# Patient Record
Sex: Male | Born: 1975 | Race: Black or African American | Hispanic: No | Marital: Single | Smoking: Current every day smoker
Health system: Southern US, Community
[De-identification: ages and names within clinical notes are randomized; demographics above are authoritative.]

## PROBLEM LIST (undated history)

## (undated) ENCOUNTER — Emergency Department (HOSPITAL_COMMUNITY): Admission: EM | Payer: Self-pay | Source: Home / Self Care

## (undated) DIAGNOSIS — F1120 Opioid dependence, uncomplicated: Secondary | ICD-10-CM

---

## 2017-05-02 ENCOUNTER — Emergency Department (HOSPITAL_COMMUNITY)
Admission: EM | Admit: 2017-05-02 | Discharge: 2017-05-03 | Disposition: A | Discharge status: Home / Self Care | Payer: Self-pay | Attending: Emergency Medicine | Admitting: Emergency Medicine

## 2017-05-02 ENCOUNTER — Encounter (HOSPITAL_COMMUNITY): Payer: Self-pay | Admitting: Emergency Medicine

## 2017-05-02 DIAGNOSIS — M25511 Pain in right shoulder: Secondary | ICD-10-CM | POA: Insufficient documentation

## 2017-05-02 DIAGNOSIS — R200 Anesthesia of skin: Secondary | ICD-10-CM | POA: Insufficient documentation

## 2017-05-02 DIAGNOSIS — Z5321 Procedure and treatment not carried out due to patient leaving prior to being seen by health care provider: Secondary | ICD-10-CM | POA: Insufficient documentation

## 2017-05-02 NOTE — ED Triage Notes (Signed)
Pt reports R shoulder pain and R hand weakness since 2000. Pt reports pain with arm movement. Grip noted to be weaker, no neuro deficits.

## 2017-05-03 ENCOUNTER — Other Ambulatory Visit: Payer: Self-pay

## 2017-05-03 ENCOUNTER — Encounter (HOSPITAL_COMMUNITY): Payer: Self-pay | Admitting: Emergency Medicine

## 2017-05-03 DIAGNOSIS — M21332 Wrist drop, left wrist: Secondary | ICD-10-CM | POA: Insufficient documentation

## 2017-05-03 DIAGNOSIS — M21331 Wrist drop, right wrist: Secondary | ICD-10-CM | POA: Insufficient documentation

## 2017-05-03 DIAGNOSIS — F17200 Nicotine dependence, unspecified, uncomplicated: Secondary | ICD-10-CM | POA: Insufficient documentation

## 2017-05-03 NOTE — ED Notes (Signed)
Pt up to nurse first desk stating he wants to leave. Apologized for wait, LWBS after triage

## 2017-05-03 NOTE — ED Triage Notes (Signed)
Pt was seen at [facility] ER this morning for flaccid rt hand.  Pt states that since he was there, his left arm is now experiencing the same symptoms.  Pt cannot extend back of hand in an upward motion.  Pt was dx with neuropraxia/cervical radiculopathy.  Pt on methadone.

## 2017-05-04 ENCOUNTER — Emergency Department (HOSPITAL_COMMUNITY)
Admission: EM | Admit: 2017-05-04 | Discharge: 2017-05-04 | Disposition: A | Discharge status: Home / Self Care | Payer: Self-pay | Attending: Emergency Medicine | Admitting: Emergency Medicine

## 2017-05-04 ENCOUNTER — Encounter (HOSPITAL_COMMUNITY): Payer: Self-pay | Admitting: Emergency Medicine

## 2017-05-04 ENCOUNTER — Emergency Department (HOSPITAL_COMMUNITY): Payer: Self-pay

## 2017-05-04 DIAGNOSIS — M21332 Wrist drop, left wrist: Secondary | ICD-10-CM

## 2017-05-04 DIAGNOSIS — M21331 Wrist drop, right wrist: Secondary | ICD-10-CM

## 2017-05-04 DIAGNOSIS — G935 Compression of brain: Secondary | ICD-10-CM

## 2017-05-04 HISTORY — DX: Opioid dependence, uncomplicated: F11.20

## 2017-05-04 LAB — I-STAT CHEM 8, ED
BUN: 11 mg/dL (ref 6–20)
CHLORIDE: 101 mmol/L (ref 101–111)
Calcium, Ion: 1.2 mmol/L (ref 1.15–1.40)
Creatinine, Ser: 0.8 mg/dL (ref 0.61–1.24)
GLUCOSE: 105 mg/dL — AB (ref 65–99)
HCT: 35 % — ABNORMAL LOW (ref 39.0–52.0)
Hemoglobin: 11.9 g/dL — ABNORMAL LOW (ref 13.0–17.0)
POTASSIUM: 3.7 mmol/L (ref 3.5–5.1)
Sodium: 138 mmol/L (ref 135–145)
TCO2: 26 mmol/L (ref 22–32)

## 2017-05-04 MED ORDER — DICLOFENAC SODIUM ER 100 MG PO TB24
100.0000 mg | ORAL_TABLET | Freq: Every day | ORAL | 0 refills | Status: AC
Start: 2017-05-04 — End: ?

## 2017-05-04 NOTE — ED Notes (Signed)
Patient waked from waiting room to treatment room 23, and was able to stand and raise arms to put the gown on.

## 2017-05-04 NOTE — ED Notes (Signed)
CT called. Was told it would be approximately 20 minutes.

## 2017-05-04 NOTE — ED Notes (Signed)
Patient was told to go to [facility] and check in at registration. Patient left going to [facility].

## 2017-05-04 NOTE — Discharge Instructions (Addendum)
Please go to [facility] for MRI of your cervical spine

## 2017-05-04 NOTE — ED Notes (Signed)
Pt. Return from CT via stretcher. 

## 2017-05-04 NOTE — ED Notes (Signed)
Dr. Palumbo at bedside. 

## 2017-05-04 NOTE — ED Notes (Signed)
Patient went to [facility] earlier today to get the numbness in his right arm evaluated. Now he states it is in his left hand now.

## 2017-05-04 NOTE — ED Notes (Signed)
Ortho paged. 

## 2017-05-04 NOTE — Progress Notes (Signed)
Orthopedic Tech Progress Note Patient Details:  Austin Green 1976/01/25 [id number]  Ortho Devices Type of Ortho Device: Velcro wrist splint Ortho Device/Splint Location: bilateral Ortho Device/Splint Interventions: Application   Austin Green 05/04/2017, 7:50 AM

## 2017-05-04 NOTE — ED Provider Notes (Signed)
[facility]-EMERGENCY DEPT Provider Note   CSN: [id number] Arrival date & time: 05/03/17  2235     History   Chief Complaint Chief Complaint  Patient presents with  . Hand Problem    HPI Austin Green is a 41 y.o. male.  HPI Austin Green is a 41 y.o. male with no medical problems, history of opiate addiction currently on methadone, presents to emergency department complaining of bilateral wrist weakness.  Patient states he woke up yesterday morning with weakness to the right hand.  He states he could not extend his wrist.  He was seen at [facility] emergency department, was told that he had radiculopathy, was given a wrist splint.  He states no imaging was performed.  He states he woke up this morning with weakness in his left wrist.  He does admit that he was blowing leaves the day before and feels like he strained his right upper arm.  He states he did have a backpack leaf blower and was blowing leaves with his right arm.  He denies any pain in his left arm.  He does endorse tingling sensation to bilateral thumbs.  He states he cannot raise his thumbs or his wrists.  He states he was given "some type of shot" [facility] department.  He denies any neck pain or injuries.  He denies any headache.  No fever or chills.  Denies any IV drug use.  Denies difficulty ambulating or any other symptoms.  Past Medical History:  Diagnosis Date  . Long-term current use of methadone for opiate dependence (HCC)   . Opiate addiction (HCC)     There are no active problems to display for this patient.   History reviewed. No pertinent surgical history.     Home Medications    Prior to Admission medications   Not on File    Family History History reviewed. No pertinent family history.  Social History Social History   Tobacco Use  . Smoking status: Current Every Day Smoker  . Smokeless tobacco: Never Used  Substance Use Topics  . Alcohol use: No   Frequency: Never  . Drug use: No     Allergies   Patient has no known allergies.   Review of Systems Review of Systems  Constitutional: Negative for chills and fever.  Respiratory: Negative for cough, chest tightness and shortness of breath.   Cardiovascular: Negative for chest pain, palpitations and leg swelling.  Gastrointestinal: Negative for abdominal distention, abdominal pain, diarrhea, nausea and vomiting.  Musculoskeletal: Positive for arthralgias. Negative for myalgias, neck pain and neck stiffness.  Skin: Negative for rash.  Allergic/Immunologic: Negative for immunocompromised state.  Neurological: Positive for weakness and numbness. Negative for dizziness, light-headedness and headaches.  All other systems reviewed and are negative.    Physical Exam Updated Vital Signs BP 112/79 (BP Location: Left Arm)   Pulse 70   Temp 98.5 F (36.9 C) (Oral)   Resp 14   Ht 5\' 11"  (1.803 m)   Wt 80.7 kg (178 lb)   SpO2 99%   BMI 24.83 kg/m   Physical Exam  Constitutional: He appears well-developed and well-nourished. No distress.  HENT:  Head: Normocephalic and atraumatic.  Eyes: Conjunctivae are normal.  Neck: Neck supple.  Cardiovascular: Normal rate, regular rhythm and normal heart sounds.  Pulmonary/Chest: Effort normal. No respiratory distress. He has no wheezes. He has no rales.  Musculoskeletal: He exhibits no edema.  Patient unable to dorsiflex bilateral wrists.  He  is unable to extend his bilateral thumbs, or do thumbs up.  He is able to oppose his thumb to every other finger.  He is unable to cross the second and third fingers.  5 out of 5 and equal strength with wrist flexion.  1 out of 5 strength with wrist dorsiflexion bilaterally.  Decreased sensation over the dorsal thumbs and webspace between thumb and first metacarpal.  Tenderness to palpation over right deltoid and right upper arm.  Pain with range of motion of right shoulder.  Normal elbows, bicep and tricep  strength intact bilaterally.  Neurological: He is alert.  Biceps and forearm reflexes intact bilaterally  Skin: Skin is warm and dry.  Nursing note and vitals reviewed.    ED Treatments / Results  Labs (all labs ordered are listed, but only abnormal results are displayed) Labs Reviewed - No data to display  EKG  EKG Interpretation None       Radiology No results found.  Procedures Procedures (including critical care time)  Medications Ordered in ED Medications - No data to display   Initial Impression / Assessment and Plan / ED Course  I have reviewed the triage vital signs and the nursing notes.  Pertinent labs & imaging results that were available during my care of the patient were reviewed by me and considered in my medical decision making (see chart for details).     Patient with new onset of bilateral wrist drop.  No history of the same.  No fever, chills, no IV drug use.  No neck pain or injuries.  He did blow leaves with a backpack blower, question whether the pressure from the straps of the blow or could have caused irritation on axillary nerve plexus.  At this time unable to rule out cervical spine cord compression.    I discussed case with Dr. Elesa MassedWard and with neurology, advised to proceed with MRI of the cervical spine.  Spoke with Dr. Silas Floodancour [place] who accepted patient for transfer for MRI, since we do not have MRI at [facility] at this time.  Final Clinical Impressions(s) / ED Diagnoses   Final diagnoses:  Wrist drop, bilateral    ED Discharge Orders    None       Jaynie CrumbleKirichenko, Pakou Rainbow, PA-C 05/04/17 0248    Ward, Layla MawKristen N, DO 05/04/17 [phone]

## 2017-05-04 NOTE — ED Provider Notes (Addendum)
MSE was initiated and I personally evaluated the patient and placed orders (if any) at  4:47 AM on May 04, 2017.  NCAT PERRL RRR CTAB NABS  Intact DTRS to BUE and intact intrinsic function to B fingers.  Intact pronation and supination and intact sensation to all nerve distributions  Strangely I cannot find a record of this [facility] at [facility], though he says he is seen there all the time.    MRI shows a Chiari 1 malformation,  Case d/w Dr. Laurence SlateAroor of neurology.  This is not likely the cause of the symptoms.    715 case d/w Dr. Delma OfficerJeff jenkins of neurosurgery.  This MRI results is not causing the symptoms the patient is having.  Have patient follow up with neurology as an outpatient for EMG.      Cyndie Woodbeck, MD 05/04/17 Merleen [id number]    Sonita Michiels, MD 05/04/17 [id number]

## 2019-08-13 ENCOUNTER — Other Ambulatory Visit: Payer: Self-pay

## 2019-08-13 ENCOUNTER — Encounter (HOSPITAL_COMMUNITY): Payer: Self-pay | Admitting: Emergency Medicine

## 2019-08-13 ENCOUNTER — Emergency Department (HOSPITAL_COMMUNITY)
Admission: EM | Admit: 2019-08-13 | Discharge: 2019-08-14 | Disposition: A | Discharge status: Home / Self Care | Payer: BC Managed Care – PPO | Attending: Emergency Medicine | Admitting: Emergency Medicine

## 2019-08-13 ENCOUNTER — Emergency Department (HOSPITAL_COMMUNITY): Payer: BC Managed Care – PPO

## 2019-08-13 DIAGNOSIS — Z79899 Other long term (current) drug therapy: Secondary | ICD-10-CM | POA: Insufficient documentation

## 2019-08-13 DIAGNOSIS — R0789 Other chest pain: Secondary | ICD-10-CM | POA: Insufficient documentation

## 2019-08-13 DIAGNOSIS — F17210 Nicotine dependence, cigarettes, uncomplicated: Secondary | ICD-10-CM | POA: Insufficient documentation

## 2019-08-13 LAB — CBC
HCT: 37.5 % — ABNORMAL LOW (ref 39.0–52.0)
Hemoglobin: 12.2 g/dL — ABNORMAL LOW (ref 13.0–17.0)
MCH: 32.7 pg (ref 26.0–34.0)
MCHC: 32.5 g/dL (ref 30.0–36.0)
MCV: 100.5 fL — ABNORMAL HIGH (ref 80.0–100.0)
Platelets: 439 10*3/uL — ABNORMAL HIGH (ref 150–400)
RBC: 3.73 MIL/uL — ABNORMAL LOW (ref 4.22–5.81)
RDW: 12.1 % (ref 11.5–15.5)
WBC: 7.1 10*3/uL (ref 4.0–10.5)
nRBC: 0 % (ref 0.0–0.2)

## 2019-08-13 LAB — BASIC METABOLIC PANEL
Anion gap: 10 (ref 5–15)
BUN: 11 mg/dL (ref 6–20)
CO2: 28 mmol/L (ref 22–32)
Calcium: 9.6 mg/dL (ref 8.9–10.3)
Chloride: 99 mmol/L (ref 98–111)
Creatinine, Ser: 0.88 mg/dL (ref 0.61–1.24)
GFR calc Af Amer: >60 mL/min (ref >60)
GFR calc non Af Amer: >60 mL/min (ref >60)
Glucose, Bld: 140 mg/dL — ABNORMAL HIGH (ref 70–99)
Potassium: 4 mmol/L (ref 3.5–5.1)
Sodium: 137 mmol/L (ref 135–145)

## 2019-08-13 LAB — TROPONIN I (HIGH SENSITIVITY): Troponin I (High Sensitivity): 3 ng/L (ref <18)

## 2019-08-13 MED ORDER — SODIUM CHLORIDE 0.9% FLUSH: 3.0000 mL | Freq: Once | INTRAVENOUS | Status: DC

## 2019-08-13 NOTE — ED Triage Notes (Signed)
Left side cp 7/10 since last Friday not getting better with Ibuprofen. Denies any injury or SOB.

## 2019-08-14 LAB — TROPONIN I (HIGH SENSITIVITY): Troponin I (High Sensitivity): 4 ng/L (ref <18)

## 2019-08-14 MED ORDER — IBUPROFEN 600 MG PO TABS: 600.0000 mg | ORAL_TABLET | Freq: Three times a day (TID) | ORAL | 0 refills | Status: AC

## 2019-08-14 MED ORDER — CYCLOBENZAPRINE HCL 10 MG PO TABS: 10.0000 mg | ORAL_TABLET | Freq: Every day | ORAL | 0 refills | Status: AC

## 2019-08-14 NOTE — Discharge Instructions (Signed)
Use heat therapy as directed. Take medications as prescribed.   Follow up with a primary care provider for routine health concerns. If you do not have a provider, use the [facility] referral provided.

## 2019-08-14 NOTE — ED Notes (Signed)
Pt seen leaving facility by registration

## 2019-08-14 NOTE — ED Provider Notes (Signed)
[facility] EMERGENCY DEPARTMENT Provider Note   CSN: [id number] Arrival date & time: 08/13/19  2145     History Chief Complaint  Patient presents with  . Chest Pain    Austin Green is a 44 y.o. male.  Patient without significant medical history presents with left lateral chest pain x 2 days. He reports he was lifting heavy buckets and now has discomfort with certain movements. No SOB, cough, fever, exertional pain, nausea or diaphoresis. No abdominal pain.   The history is provided by the patient. No language interpreter was used.  Chest Pain Associated symptoms: no abdominal pain, no cough, no diaphoresis, no fever, no nausea and no shortness of breath        Past Medical History:  Diagnosis Date  . Long-term current use of methadone for opiate dependence ([place])   . Opiate addiction ([place])     There are no problems to display for this patient.   History reviewed. No pertinent surgical history.     No family history on file.  Social History   Tobacco Use  . Smoking status: Current Every Day Smoker    Packs/day: 0.50  . Smokeless tobacco: Never Used  Substance Use Topics  . Alcohol use: No  . Drug use: No    Home Medications Prior to Admission medications   Medication Sig Start Date End Date Taking? Authorizing Provider  Diclofenac Sodium CR (VOLTAREN-XR) 100 MG 24 hr tablet Take 1 tablet (100 mg total) by mouth daily. 05/04/17   Palumbo, April, MD  methadone (DOLOPHINE) 10 MG/ML solution Take 85 mg by mouth daily.    [provider]    Allergies    Shrimp [shellfish allergy]  Review of Systems   Review of Systems  Constitutional: Negative for diaphoresis and fever.  Respiratory: Negative for cough and shortness of breath.   Cardiovascular: Positive for chest pain.  Gastrointestinal: Negative for abdominal pain and nausea.    Physical Exam Updated Vital Signs BP 128/85 (BP Location: Right Arm)   Pulse 93   Temp 98.3  F (36.8 C) (Oral)   Resp 18   Ht 5\' 11"  (1.803 m)   Wt 74.8 kg   SpO2 100%   BMI 23.01 kg/m   Physical Exam Vitals and nursing note reviewed.  Constitutional:      Appearance: He is well-developed.  HENT:     Head: Normocephalic.  Cardiovascular:     Rate and Rhythm: Normal rate and regular rhythm.  Pulmonary:     Effort: Pulmonary effort is normal.     Breath sounds: Normal breath sounds. No wheezing, rhonchi or rales.  Chest:     Chest wall: No tenderness.  Abdominal:     General: Bowel sounds are normal.     Palpations: Abdomen is soft.     Tenderness: There is no abdominal tenderness. There is no guarding or rebound.  Musculoskeletal:        General: No swelling or tenderness. Normal range of motion.     Cervical back: Normal range of motion and neck supple.  Skin:    General: Skin is warm and dry.     Findings: No rash.  Neurological:     Mental Status: He is alert and oriented to person, place, and time.     ED Results / Procedures / Treatments   Labs (all labs ordered are listed, but only abnormal results are displayed) Labs Reviewed  BASIC METABOLIC PANEL - Abnormal; Notable for the  following components:      Result Value   Glucose, Bld 140 (*)    All other components within normal limits  CBC - Abnormal; Notable for the following components:   RBC 3.73 (*)    Hemoglobin 12.2 (*)    HCT 37.5 (*)    MCV 100.5 (*)    Platelets 439 (*)    All other components within normal limits  TROPONIN I (HIGH SENSITIVITY)  TROPONIN I (HIGH SENSITIVITY)   Results for orders placed or performed during the hospital encounter of 08/13/19  Basic metabolic panel  Result Value Ref Range   Sodium 137 135 - 145 mmol/L   Potassium 4.0 3.5 - 5.1 mmol/L   Chloride 99 98 - 111 mmol/L   CO2 28 22 - 32 mmol/L   Glucose, Bld 140 (H) 70 - 99 mg/dL   BUN 11 6 - 20 mg/dL   Creatinine, Ser 5.73 0.61 - 1.24 mg/dL   Calcium 9.6 8.9 - 22.0 mg/dL   GFR calc non Af Amer >60 >60  mL/min   GFR calc Af Amer >60 >60 mL/min   Anion gap 10 5 - 15  CBC  Result Value Ref Range   WBC 7.1 4.0 - 10.5 K/uL   RBC 3.73 (L) 4.22 - 5.81 MIL/uL   Hemoglobin 12.2 (L) 13.0 - 17.0 g/dL   HCT 25.4 (L) 27.0 - 62.3 %   MCV 100.5 (H) 80.0 - 100.0 fL   MCH 32.7 26.0 - 34.0 pg   MCHC 32.5 30.0 - 36.0 g/dL   RDW 76.2 83.1 - 51.7 %   Platelets 439 (H) 150 - 400 K/uL   nRBC 0.0 0.0 - 0.2 %  Troponin I (High Sensitivity)  Result Value Ref Range   Troponin I (High Sensitivity) 3 <18 ng/L  Troponin I (High Sensitivity)  Result Value Ref Range   Troponin I (High Sensitivity) 4 <18 ng/L    EKG EKG Interpretation  Date/Time:  Monday August 13 2019 21:50:11 EST Ventricular Rate:  93 PR Interval:  170 QRS Duration: 90 QT Interval:  362 QTC Calculation: 450 R Axis:   47 Text Interpretation: Normal sinus rhythm Normal ECG Confirmed by Ross Marcus ([id number]) on 08/14/2019 3:48:03 AM   Radiology DG Chest 2 View  Result Date: 08/13/2019 CLINICAL DATA:  Chest pain EXAM: CHEST - 2 VIEW COMPARISON:  None. FINDINGS: The heart size and mediastinal contours are within normal limits. Both lungs are clear. The visualized skeletal structures are unremarkable. IMPRESSION: No active cardiopulmonary disease. Electronically Signed   By: Jonna Clark M.D.   On: 08/13/2019 22:19    Procedures Procedures (including critical care time)  Medications Ordered in ED Medications  sodium chloride flush (NS) 0.9 % injection 3 mL (has no administration in time range)    ED Course  I have reviewed the triage vital signs and the nursing notes.  Pertinent labs & imaging results that were available during my care of the patient were reviewed by me and considered in my medical decision making (see chart for details).    MDM Rules/Calculators/A&P                      Patient to ED with pain with movement in the left lateral chest x 2 days after heavy lifting. He has taken 200 mg ibuprofen without relief.  No cough, SOB, pleuritic pain.  He is very well appearing. EKG NSR, CXR clear, labs unremarkable including troponin x 2.  Symptoms follow a musculoskeletal pattern. Will increase ibuprofen to 600 mg 3 times daily and provide muscle relaxer for use at night. He needs a PCP referral.   Final Clinical Impression(s) / ED Diagnoses Final diagnoses:  None   1. Chest wall pain  Rx / DC Orders ED Discharge Orders    None       Elpidio Anis, PA-C 08/14/19 0425    Shon Baton, MD 08/14/19 [phone]

## 2019-08-14 NOTE — ED Notes (Signed)
Pt returned to ED stating he has decided to stay and be seen.

## 2019-08-31 ENCOUNTER — Emergency Department: Payer: BC Managed Care – PPO

## 2019-08-31 ENCOUNTER — Other Ambulatory Visit: Payer: Self-pay

## 2019-08-31 ENCOUNTER — Emergency Department
Admission: EM | Admit: 2019-08-31 | Discharge: 2019-08-31 | Disposition: A | Discharge status: Home / Self Care | Payer: BC Managed Care – PPO | Attending: Emergency Medicine | Admitting: Emergency Medicine

## 2019-08-31 DIAGNOSIS — R0789 Other chest pain: Secondary | ICD-10-CM | POA: Diagnosis present

## 2019-08-31 DIAGNOSIS — F17200 Nicotine dependence, unspecified, uncomplicated: Secondary | ICD-10-CM | POA: Insufficient documentation

## 2019-08-31 LAB — CBC
HCT: 34 % — ABNORMAL LOW (ref 39.0–52.0)
Hemoglobin: 11.2 g/dL — ABNORMAL LOW (ref 13.0–17.0)
MCH: 32.5 pg (ref 26.0–34.0)
MCHC: 32.9 g/dL (ref 30.0–36.0)
MCV: 98.6 fL (ref 80.0–100.0)
Platelets: 395 10*3/uL (ref 150–400)
RBC: 3.45 MIL/uL — ABNORMAL LOW (ref 4.22–5.81)
RDW: 12.1 % (ref 11.5–15.5)
WBC: 4.8 10*3/uL (ref 4.0–10.5)
nRBC: 0 % (ref 0.0–0.2)

## 2019-08-31 LAB — BASIC METABOLIC PANEL
Anion gap: 9 (ref 5–15)
BUN: 11 mg/dL (ref 6–20)
CO2: 29 mmol/L (ref 22–32)
Calcium: 9.2 mg/dL (ref 8.9–10.3)
Chloride: 99 mmol/L (ref 98–111)
Creatinine, Ser: 0.88 mg/dL (ref 0.61–1.24)
GFR calc Af Amer: >60 mL/min (ref >60)
GFR calc non Af Amer: >60 mL/min (ref >60)
Glucose, Bld: 93 mg/dL (ref 70–99)
Potassium: 4.1 mmol/L (ref 3.5–5.1)
Sodium: 137 mmol/L (ref 135–145)

## 2019-08-31 LAB — TROPONIN I (HIGH SENSITIVITY)
Troponin I (High Sensitivity): <2 ng/L (ref <18)
Troponin I (High Sensitivity): <2 ng/L (ref <18)

## 2019-08-31 MED ORDER — DIAZEPAM 5 MG PO TABS: 5.0000 mg | ORAL_TABLET | Freq: Three times a day (TID) | ORAL | 0 refills | Status: AC | PRN

## 2019-08-31 MED ORDER — SODIUM CHLORIDE 0.9% FLUSH: 3.0000 mL | Freq: Once | INTRAVENOUS | Status: DC

## 2019-08-31 MED ORDER — DIAZEPAM 5 MG PO TABS
5.0000 mg | ORAL_TABLET | Freq: Once | ORAL | Status: AC
  Administered 2019-08-31: 5 mg via ORAL
  Filled 2019-08-31: qty 1

## 2019-08-31 MED ORDER — IBUPROFEN 800 MG PO TABS
800.0000 mg | ORAL_TABLET | Freq: Once | ORAL | Status: AC
  Administered 2019-08-31: 15:00:00 800 mg via ORAL
  Filled 2019-08-31: qty 1

## 2019-08-31 MED ORDER — IBUPROFEN 800 MG PO TABS: 800.0000 mg | ORAL_TABLET | Freq: Three times a day (TID) | ORAL | 0 refills | Status: AC | PRN

## 2019-08-31 NOTE — ED Triage Notes (Signed)
Pt comes via POV from home with c/o left sided chest pain that started Wednesday. Pt denies any N/V.D  Pt states sharp pain in chest. Pt states it woke him up in sleep.

## 2019-08-31 NOTE — ED Provider Notes (Signed)
[facility] Emergency Department Provider Note       Time seen: ----------------------------------------- 2:37 PM on 08/31/2019 -----------------------------------------   I have reviewed the triage vital signs and the nursing notes.  HISTORY   Chief Complaint Chest Pain    HPI Austin Green is a 44 y.o. male with a history of chronic pain who presents to the ED for left-sided chest pain that started Wednesday.  Patient denies any nausea, vomiting or diarrhea.  Patient describes a sharp pain in his chest that woke him up from sleep.  Discomfort is 5 out of 10.  Past Medical History:  Diagnosis Date  . Long-term current use of methadone for opiate dependence ([place])   . Opiate addiction ([place])     There are no problems to display for this patient.   History reviewed. No pertinent surgical history.  Allergies Shrimp [shellfish allergy]  Social History Social History   Tobacco Use  . Smoking status: Current Every Day Smoker    Packs/day: 0.50  . Smokeless tobacco: Never Used  Substance Use Topics  . Alcohol use: No  . Drug use: No    Review of Systems Constitutional: Negative for fever. Cardiovascular: Positive for chest pain Respiratory: Negative for shortness of breath. Gastrointestinal: Negative for abdominal pain, vomiting and diarrhea. Musculoskeletal: Negative for back pain. Skin: Negative for rash. Neurological: Negative for headaches, focal weakness or numbness.  All systems negative/normal/unremarkable except as stated in the HPI  ____________________________________________   PHYSICAL EXAM:  VITAL SIGNS: ED Triage Vitals  Enc Vitals Group     BP 08/31/19 1250 117/70     Pulse Rate 08/31/19 1250 94     Resp 08/31/19 1250 20     Temp 08/31/19 1250 97.9 F (36.6 C)     Temp Source 08/31/19 1250 Oral     SpO2 08/31/19 1250 98 %     Weight 08/31/19 1250 164 lb (74.4 kg)     Height 08/31/19 1250 5\' 11"  (1.803 m)   Head Circumference --      Peak Flow --      Pain Score 08/31/19 1255 5     Pain Loc --      Pain Edu? --      Excl. in [place]? --     Constitutional: Alert and oriented. Well appearing and in no distress. Eyes: Conjunctivae are normal. Normal extraocular movements. Cardiovascular: Normal rate, regular rhythm. No murmurs, rubs, or gallops. Respiratory: Normal respiratory effort without tachypnea nor retractions. Breath sounds are clear and equal bilaterally. No wheezes/rales/rhonchi. Gastrointestinal: Soft and nontender. Normal bowel sounds Musculoskeletal: Nontender with normal range of motion in extremities. No lower extremity tenderness nor edema. Neurologic:  Normal speech and language. No gross focal neurologic deficits are appreciated.  Skin:  Skin is warm, dry and intact. No rash noted. Psychiatric: Mood and affect are normal. Speech and behavior are normal.  ____________________________________________  EKG: Interpreted by me.  Sinus rhythm with rate of 100 bpm, possible inferior infarct age-indeterminate, normal axis, normal QT  ____________________________________________  ED COURSE:  As part of my medical decision making, I reviewed the following data within the [place] History obtained from family if available, nursing notes, old chart and ekg, as well as notes from prior ED visits. Patient presented for chest pain, we will assess with labs and imaging as indicated at this time.   Procedures  Doristine Section was evaluated in Emergency Department on 08/31/2019 for the symptoms described in the  history of present illness. He was evaluated in the context of the global COVID-19 pandemic, which necessitated consideration that the patient might be at risk for infection with the SARS-CoV-2 virus that causes COVID-19. Institutional protocols and algorithms that pertain to the evaluation of patients at risk for COVID-19 are in a state of rapid change based on  information released by regulatory bodies including the CDC and federal and state organizations. These policies and algorithms were followed during the patient's care in the ED.  ____________________________________________   LABS (pertinent positives/negatives)  Labs Reviewed  CBC - Abnormal; Notable for the following components:      Result Value   RBC 3.45 (*)    Hemoglobin 11.2 (*)    HCT 34.0 (*)    All other components within normal limits  BASIC METABOLIC PANEL  TROPONIN I (HIGH SENSITIVITY)  TROPONIN I (HIGH SENSITIVITY)    RADIOLOGY Images were viewed by me  Chest x-ray IMPRESSION: Lungs clear.  Cardiac silhouette within normal limits. ____________________________________________   DIFFERENTIAL DIAGNOSIS   Musculoskeletal pain, GERD, MI, unstable angina, PE, pneumothorax  FINAL ASSESSMENT AND PLAN  Chest pain   Plan: The patient had presented for sharp chest pain. Patient's labs were reassuring including repeat troponin testing. Patient's imaging does not reveal any acute process.  Likely musculoskeletal in origin.  He is cleared for outpatient follow-up.   Ulice Dash, MD    Note: This note was generated in part or whole with voice recognition software. Voice recognition is usually quite accurate but there are transcription errors that can and very often do occur. I apologize for any typographical errors that were not detected and corrected.     Emily Filbert, MD 08/31/19 [phone]

## 2019-09-22 ENCOUNTER — Emergency Department (HOSPITAL_COMMUNITY)
Admission: EM | Admit: 2019-09-22 | Discharge: 2019-09-22 | Disposition: A | Discharge status: Home / Self Care | Payer: BC Managed Care – PPO | Attending: Emergency Medicine | Admitting: Emergency Medicine

## 2019-09-22 ENCOUNTER — Other Ambulatory Visit: Payer: Self-pay

## 2019-09-22 ENCOUNTER — Encounter (HOSPITAL_COMMUNITY): Payer: Self-pay | Admitting: Emergency Medicine

## 2019-09-22 ENCOUNTER — Emergency Department (HOSPITAL_COMMUNITY): Payer: BC Managed Care – PPO

## 2019-09-22 DIAGNOSIS — R0789 Other chest pain: Secondary | ICD-10-CM | POA: Diagnosis present

## 2019-09-22 DIAGNOSIS — F17210 Nicotine dependence, cigarettes, uncomplicated: Secondary | ICD-10-CM | POA: Diagnosis not present

## 2019-09-22 DIAGNOSIS — X500XXA Overexertion from strenuous movement or load, initial encounter: Secondary | ICD-10-CM | POA: Diagnosis not present

## 2019-09-22 DIAGNOSIS — M546 Pain in thoracic spine: Secondary | ICD-10-CM | POA: Diagnosis not present

## 2019-09-22 LAB — CBC
HCT: 34.9 % — ABNORMAL LOW (ref 39.0–52.0)
Hemoglobin: 11.2 g/dL — ABNORMAL LOW (ref 13.0–17.0)
MCH: 32.6 pg (ref 26.0–34.0)
MCHC: 32.1 g/dL (ref 30.0–36.0)
MCV: 101.5 fL — ABNORMAL HIGH (ref 80.0–100.0)
Platelets: 383 10*3/uL (ref 150–400)
RBC: 3.44 MIL/uL — ABNORMAL LOW (ref 4.22–5.81)
RDW: 12.4 % (ref 11.5–15.5)
WBC: 4.7 10*3/uL (ref 4.0–10.5)
nRBC: 0 % (ref 0.0–0.2)

## 2019-09-22 LAB — BASIC METABOLIC PANEL
Anion gap: 9 (ref 5–15)
BUN: 9 mg/dL (ref 6–20)
CO2: 27 mmol/L (ref 22–32)
Calcium: 9.9 mg/dL (ref 8.9–10.3)
Chloride: 101 mmol/L (ref 98–111)
Creatinine, Ser: 0.88 mg/dL (ref 0.61–1.24)
GFR calc Af Amer: >60 mL/min (ref >60)
GFR calc non Af Amer: >60 mL/min (ref >60)
Glucose, Bld: 103 mg/dL — ABNORMAL HIGH (ref 70–99)
Potassium: 3.6 mmol/L (ref 3.5–5.1)
Sodium: 137 mmol/L (ref 135–145)

## 2019-09-22 LAB — TROPONIN I (HIGH SENSITIVITY)
Troponin I (High Sensitivity): 5 ng/L (ref <18)
Troponin I (High Sensitivity): 5 ng/L (ref <18)

## 2019-09-22 MED ORDER — SODIUM CHLORIDE 0.9% FLUSH: 3.0000 mL | Freq: Once | INTRAVENOUS | Status: DC

## 2019-09-22 NOTE — ED Notes (Signed)
Pt transported to Xray. 

## 2019-09-22 NOTE — ED Provider Notes (Signed)
[facility] EMERGENCY DEPARTMENT Provider Note   CSN: [id number] Arrival date & time: 09/22/19  1706     History Chief Complaint  Patient presents with  . Chest Pain    [facility] is a 44 y.o. male who presents to the ED today with complaint of gradual onset, constant, left upper back pain/left shoulder pain that began 2 days ago.  He mentions that he does a lot of heavy lifting at work and thinks he may have strained a muscle.  He states he took some ibuprofen with relief.  Per chart review patient was seen at an ED in [facility] yesterday with complaint of chest pain, cardiac work-up negative at that time.  Is denying any active chest pain currently.  Denies shortness of breath, nausea, vomiting, diaphoresis, leg swelling, any other associated symptoms.  No recent prolonged travel or immobilization.  No history of DVT/PE.  No exogenous hormone use.  No active malignancy.  No hemoptysis.  Is a current every day smoker.  He denies any family history of MI.   The history is provided by the patient and medical records.    HPI: A 44 year old patient presents for evaluation of chest pain. Initial onset of pain was more than 6 hours ago. The patient's chest pain is not worse with exertion. The patient's chest pain is middle- or left-sided, is not well-localized, is not described as heaviness/pressure/tightness, is not sharp and does not radiate to the arms/jaw/neck. The patient does not complain of nausea and denies diaphoresis. The patient has smoked in the past 90 days. The patient has no history of stroke, has no history of peripheral artery disease, denies any history of treated diabetes, has no relevant family history of coronary artery disease (first degree relative at less than age 4), is not hypertensive, has no history of hypercholesterolemia and does not have an elevated BMI (>=30).   Past Medical History:  Diagnosis Date  . Long-term current use of  methadone for opiate dependence (HCC)   . Opiate addiction (HCC)     There are no problems to display for this patient.   History reviewed. No pertinent surgical history.     No family history on file.  Social History   Tobacco Use  . Smoking status: Current Every Day Smoker    Packs/day: 0.50  . Smokeless tobacco: Never Used  Substance Use Topics  . Alcohol use: No  . Drug use: No    Home Medications Prior to Admission medications   Medication Sig Start Date End Date Taking? Authorizing Provider  cyclobenzaprine (FLEXERIL) 10 MG tablet Take 1 tablet (10 mg total) by mouth at bedtime. 08/14/19   Elpidio Anis, PA-C  diazepam (VALIUM) 5 MG tablet Take 1 tablet (5 mg total) by mouth every 8 (eight) hours as needed for muscle spasms. 08/31/19   Emily Filbert, MD  Diclofenac Sodium CR (VOLTAREN-XR) 100 MG 24 hr tablet Take 1 tablet (100 mg total) by mouth daily. 05/04/17   Palumbo, April, MD  ibuprofen (ADVIL) 600 MG tablet Take 1 tablet (600 mg total) by mouth 3 (three) times daily. 08/14/19   Elpidio Anis, PA-C  ibuprofen (ADVIL) 800 MG tablet Take 1 tablet (800 mg total) by mouth every 8 (eight) hours as needed. 08/31/19   Emily Filbert, MD  methadone (DOLOPHINE) 10 MG/ML solution Take 85 mg by mouth daily.    [provider]    Allergies    Shrimp [shellfish allergy]  Review of Systems   Review of Systems  Constitutional: Negative for chills and fever.  Respiratory: Negative for shortness of breath.   Cardiovascular: Negative for chest pain.  Gastrointestinal: Negative for abdominal pain, nausea and vomiting.  Musculoskeletal: Positive for back pain.  All other systems reviewed and are negative.   Physical Exam Updated Vital Signs BP (!) 136/93   Pulse 70   Temp 98.8 F (37.1 C) (Oral)   Resp 13   SpO2 99%   Physical Exam Vitals and nursing note reviewed.  Constitutional:      Appearance: He is not ill-appearing or diaphoretic.  HENT:       Head: Normocephalic and atraumatic.  Eyes:     Conjunctiva/sclera: Conjunctivae normal.  Cardiovascular:     Rate and Rhythm: Normal rate and regular rhythm.     Pulses:          Radial pulses are 2+ on the right side and 2+ on the left side.       Dorsalis pedis pulses are 2+ on the right side and 2+ on the left side.  Pulmonary:     Effort: Pulmonary effort is normal.     Breath sounds: Normal breath sounds. No decreased breath sounds, wheezing, rhonchi or rales.  Chest:     Chest wall: No tenderness.  Abdominal:     Palpations: Abdomen is soft.     Tenderness: There is no abdominal tenderness. There is no guarding or rebound.  Musculoskeletal:     Cervical back: Neck supple.     Right lower leg: No edema.     Left lower leg: No edema.     Comments: No C, T, or L midline spinal tenderness. + left parathoracic TTP. ROM intact to neck and back. Strength 5/5 to BUE and BLEs. Sensation intact throughout. 2+ distal pulses bilaterally.   Skin:    General: Skin is warm and dry.  Neurological:     Mental Status: He is alert.     ED Results / Procedures / Treatments   Labs (all labs ordered are listed, but only abnormal results are displayed) Labs Reviewed  BASIC METABOLIC PANEL - Abnormal; Notable for the following components:      Result Value   Glucose, Bld 103 (*)    All other components within normal limits  CBC - Abnormal; Notable for the following components:   RBC 3.44 (*)    Hemoglobin 11.2 (*)    HCT 34.9 (*)    MCV 101.5 (*)    All other components within normal limits  TROPONIN I (HIGH SENSITIVITY)  TROPONIN I (HIGH SENSITIVITY)    EKG EKG Interpretation  Date/Time:  Saturday September 22 2019 17:09:14 EDT Ventricular Rate:  86 PR Interval:  162 QRS Duration: 80 QT Interval:  392 QTC Calculation: 469 R Axis:   12 Text Interpretation: Normal sinus rhythm Minimal voltage criteria for LVH, may be normal variant ( R in aVL ) Inferior infarct , age  undetermined Abnormal ECG No significant change since last tracing Confirmed by [facility]  MD, Juanda Crumble [phone]) on 09/22/2019 6:21:35 PM   Radiology DG Chest 2 View  Result Date: 09/22/2019 CLINICAL DATA:  44 year old male with chest pain. EXAM: CHEST - 2 VIEW COMPARISON:  Chest radiograph dated 08/31/2019. FINDINGS: The heart size and mediastinal contours are within normal limits. Both lungs are clear. The visualized skeletal structures are unremarkable. IMPRESSION: No active cardiopulmonary disease. Electronically Signed   By: Anner Crete M.D.   On:  09/22/2019 17:58    Procedures Procedures (including critical care time)  Medications Ordered in ED Medications  sodium chloride flush (NS) 0.9 % injection 3 mL (3 mLs Intravenous Not Given 09/22/19 1847)    ED Course  I have reviewed the triage vital signs and the nursing notes.  Pertinent labs & imaging results that were available during my care of the patient were reviewed by me and considered in my medical decision making (see chart for details).    MDM Rules/Calculators/A&P HEAR Score: 2                    44 year old male presents the ED today complaining of left upper back pain for 2 days.  While in the waiting room he was worked up from a cardiac standpoint.  Patient is denying any active chest pain.  He was seen at [facility] yesterday with complaint of chest pain however states he left prior to his work-up being finished due to long wait times.  Patient states he works in [employer] but lives here.  Does do a lot of heavy lifting for work and feels like he may have strained his back.  His pain is improved with ibuprofen.  On arrival to the ED patient is afebrile, nontachycardic and nontachypneic.  He appears to be in no acute distress.  He has no active chest pain currently.  Is equal pulses bilaterally.  His blood pressure is normotensive.  There is no concern for dissection.  He has no risk factors for PE and  PERC negative.  Patient's pain is more musculoskeletal given he has tenderness to the left thoracic spinal muscles.  He denies any radiation into his arm or into his jaw.  Without family history however he is a current every day smoker.    EKG without any significant change since last.  Lab work was obtained prior to being seen. CBC without leukocytosis.  Hemoglobin stable compared to baseline. BMP without electrolyte abnormalities.  Troponin of 5, will repeat.  X-ray negative.  Repeat troponin of 5. Heart score of 2. Feel pt can be discharged at this time. I suspect his pain is musculoskeletal in nature. Advised on Ibuprofen and Tylenol PRN for pain. Pt to follow up with PCP. He is in agreement with plan and stable for discharge home.   This note was prepared using Dragon voice recognition software and may include unintentional dictation errors due to the inherent limitations of voice recognition software.  Final Clinical Impression(s) / ED Diagnoses Final diagnoses:  Acute left-sided thoracic back pain    Rx / DC Orders ED Discharge Orders    None       Discharge Instructions     Your labwork was reassuring today. Your symptoms are likely related to muscle soreness. Please take Ibuprofen and Tylenol as needed for pain.   Return to the ED for any worsening symptoms including worsening pain, chest pain, shortness of breath, nausea, vomiting, excessive sweating, passing out       Tanda Rockers, Cordelia Poche 09/22/19 2049    Pollyann Savoy, MD 09/22/19 2248

## 2019-09-22 NOTE — ED Triage Notes (Signed)
C/o L sided chest pain, L shoulder blade pain, and L arm pain since this morning.  Denies SOB, nausea, and vomiting.

## 2019-09-22 NOTE — Discharge Instructions (Addendum)
Your labwork was reassuring today. Your symptoms are likely related to muscle soreness. Please take Ibuprofen and Tylenol as needed for pain.   Return to the ED for any worsening symptoms including worsening pain, chest pain, shortness of breath, nausea, vomiting, excessive sweating, passing out

## 2019-12-23 ENCOUNTER — Emergency Department (HOSPITAL_COMMUNITY): Payer: BC Managed Care – PPO

## 2019-12-23 ENCOUNTER — Encounter (HOSPITAL_COMMUNITY): Payer: Self-pay | Admitting: Emergency Medicine

## 2019-12-23 ENCOUNTER — Emergency Department (HOSPITAL_COMMUNITY)
Admission: EM | Admit: 2019-12-23 | Discharge: 2019-12-24 | Disposition: A | Discharge status: Home / Self Care | Payer: BC Managed Care – PPO | Attending: Emergency Medicine | Admitting: Emergency Medicine

## 2019-12-23 ENCOUNTER — Other Ambulatory Visit: Payer: Self-pay

## 2019-12-23 DIAGNOSIS — F17210 Nicotine dependence, cigarettes, uncomplicated: Secondary | ICD-10-CM | POA: Diagnosis not present

## 2019-12-23 DIAGNOSIS — R072 Precordial pain: Secondary | ICD-10-CM | POA: Diagnosis not present

## 2019-12-23 DIAGNOSIS — Z79899 Other long term (current) drug therapy: Secondary | ICD-10-CM | POA: Diagnosis not present

## 2019-12-23 DIAGNOSIS — R0789 Other chest pain: Secondary | ICD-10-CM | POA: Diagnosis present

## 2019-12-23 DIAGNOSIS — R0602 Shortness of breath: Secondary | ICD-10-CM | POA: Diagnosis not present

## 2019-12-23 LAB — CBC
HCT: 38.4 % — ABNORMAL LOW (ref 39.0–52.0)
Hemoglobin: 12.5 g/dL — ABNORMAL LOW (ref 13.0–17.0)
MCH: 32.6 pg (ref 26.0–34.0)
MCHC: 32.6 g/dL (ref 30.0–36.0)
MCV: 100.3 fL — ABNORMAL HIGH (ref 80.0–100.0)
Platelets: 426 10*3/uL — ABNORMAL HIGH (ref 150–400)
RBC: 3.83 MIL/uL — ABNORMAL LOW (ref 4.22–5.81)
RDW: 12 % (ref 11.5–15.5)
WBC: 6.6 10*3/uL (ref 4.0–10.5)
nRBC: 0 % (ref 0.0–0.2)

## 2019-12-23 LAB — BASIC METABOLIC PANEL
Anion gap: 9 (ref 5–15)
BUN: 12 mg/dL (ref 6–20)
CO2: 28 mmol/L (ref 22–32)
Calcium: 9.5 mg/dL (ref 8.9–10.3)
Chloride: 100 mmol/L (ref 98–111)
Creatinine, Ser: 0.84 mg/dL (ref 0.61–1.24)
GFR calc Af Amer: >60 mL/min (ref >60)
GFR calc non Af Amer: >60 mL/min (ref >60)
Glucose, Bld: 113 mg/dL — ABNORMAL HIGH (ref 70–99)
Potassium: 4.2 mmol/L (ref 3.5–5.1)
Sodium: 137 mmol/L (ref 135–145)

## 2019-12-23 LAB — TROPONIN I (HIGH SENSITIVITY)
Troponin I (High Sensitivity): <2 ng/L (ref <18)
Troponin I (High Sensitivity): 2 ng/L (ref <18)

## 2019-12-23 MED ORDER — SODIUM CHLORIDE 0.9% FLUSH: 3.0000 mL | Freq: Once | INTRAVENOUS | Status: DC

## 2019-12-23 NOTE — ED Triage Notes (Signed)
Pt c/o chest pain and shortness of breath that started today. Reports he got his second dose COVID vaccine 2 weeks ago.

## 2019-12-23 NOTE — ED Provider Notes (Signed)
[facility] EMERGENCY DEPARTMENT Provider Note  CSN: [id number] Arrival date & time: 12/23/19 1806  Chief Complaint(s) Chest Pain and Shortness of Breath  HPI [facility] is a 44 y.o. male   CC: chest pain  Onset/Duration: 2 weeks Timing: intermittent, lasts a few seconds per episoded Location: anterior Quality: tightness Severity: mild Modifying Factors:  Improved by: self resolving  Worsened by: nothing Associated Signs/Symptoms:  Pertinent (+): mild SOB with the pain.  Pertinent (-): fevers, chills, cough, congestion, n/v/d, abd pain. Context: symptoms started a few days after 2nd covid vaccine.  Patient has also been taking Tylenol, Motrin, and ASA   HPI  Past Medical History Past Medical History:  Diagnosis Date  . Long-term current use of methadone for opiate dependence (HCC)   . Opiate addiction (HCC)    There are no problems to display for this patient.  Home Medication(s) Prior to Admission medications   Medication Sig Start Date End Date Taking? Authorizing Provider  cyclobenzaprine (FLEXERIL) 10 MG tablet Take 1 tablet (10 mg total) by mouth at bedtime. 08/14/19   Elpidio Anis, PA-C  diazepam (VALIUM) 5 MG tablet Take 1 tablet (5 mg total) by mouth every 8 (eight) hours as needed for muscle spasms. 08/31/19   Emily Filbert, MD  Diclofenac Sodium CR (VOLTAREN-XR) 100 MG 24 hr tablet Take 1 tablet (100 mg total) by mouth daily. 05/04/17   Palumbo, April, MD  ibuprofen (ADVIL) 600 MG tablet Take 1 tablet (600 mg total) by mouth 3 (three) times daily. 08/14/19   Elpidio Anis, PA-C  ibuprofen (ADVIL) 800 MG tablet Take 1 tablet (800 mg total) by mouth every 8 (eight) hours as needed. 08/31/19   Emily Filbert, MD  methadone (DOLOPHINE) 10 MG/ML solution Take 85 mg by mouth daily.    [provider]                                                                                                                                     Past Surgical History History reviewed. No pertinent surgical history. Family History No family history on file.  Social History Social History   Tobacco Use  . Smoking status: Current Every Day Smoker    Packs/day: 0.50  . Smokeless tobacco: Never Used  Substance Use Topics  . Alcohol use: No  . Drug use: No   Allergies Shrimp [shellfish allergy]  Review of Systems Review of Systems All other systems are reviewed and are negative for acute change except as noted in the HPI  Physical Exam Vital Signs  I have reviewed the triage vital signs BP 109/74 (BP Location: Left Arm)   Pulse 92   Temp 98.2 F (36.8 C) (Oral)   Resp 18   Ht 5\' 11"  (1.803 m)   Wt 81.2 kg   SpO2 98%   BMI 24.97 kg/m   Physical Exam Vitals reviewed.  Constitutional:  General: He is not in acute distress.    Appearance: He is well-developed. He is not diaphoretic.  HENT:     Head: Normocephalic and atraumatic.     Nose: Nose normal.  Eyes:     General: No scleral icterus.       Right eye: No discharge.        Left eye: No discharge.     Conjunctiva/sclera: Conjunctivae normal.     Pupils: Pupils are equal, round, and reactive to light.  Cardiovascular:     Rate and Rhythm: Normal rate and regular rhythm.     Heart sounds: No murmur heard.  No friction rub. No gallop.   Pulmonary:     Effort: Pulmonary effort is normal. No respiratory distress.     Breath sounds: Normal breath sounds. No stridor. No rales.  Abdominal:     General: There is no distension.     Palpations: Abdomen is soft.     Tenderness: There is no abdominal tenderness.  Musculoskeletal:        General: No tenderness.     Cervical back: Normal range of motion and neck supple.  Skin:    General: Skin is warm and dry.     Findings: No erythema or rash.  Neurological:     Mental Status: He is alert and oriented to person, place, and time.     ED Results and Treatments Labs (all labs ordered are  listed, but only abnormal results are displayed) Labs Reviewed  BASIC METABOLIC PANEL - Abnormal; Notable for the following components:      Result Value   Glucose, Bld 113 (*)    All other components within normal limits  CBC - Abnormal; Notable for the following components:   RBC 3.83 (*)    Hemoglobin 12.5 (*)    HCT 38.4 (*)    MCV 100.3 (*)    Platelets 426 (*)    All other components within normal limits  TROPONIN I (HIGH SENSITIVITY)  TROPONIN I (HIGH SENSITIVITY)                                                                                                                         EKG  EKG Interpretation  Date/Time:  Sunday December 23 2019 18:14:18 EDT Ventricular Rate:  102 PR Interval:  156 QRS Duration: 86 QT Interval:  352 QTC Calculation: 458 R Axis:   37 Text Interpretation: Sinus tachycardia Possible Inferior infarct , age undetermined Abnormal ECG No acute changes Confirmed by Drema Pry [phone]) on 12/23/2019 11:35:28 PM      Radiology DG Chest 2 View  Result Date: 12/23/2019 CLINICAL DATA:  Chest pain EXAM: CHEST - 2 VIEW COMPARISON:  September 22, 2019 FINDINGS: The heart size and mediastinal contours are within normal limits. Both lungs are clear. The visualized skeletal structures are unremarkable. IMPRESSION: No active cardiopulmonary disease. Electronically Signed   By: Gerome Sam III M.D   On: 12/23/2019 18:40  Pertinent labs & imaging results that were available during my care of the patient were reviewed by me and considered in my medical decision making (see chart for details).  Medications Ordered in ED Medications  sodium chloride flush (NS) 0.9 % injection 3 mL (has no administration in time range)                                                                                                                                    Procedures Procedures  (including critical care time)  Medical Decision Making / ED Course I have reviewed the  nursing notes for this encounter and the patient's prior records (if available in EHR or on provided paperwork).   [name] was evaluated in Emergency Department on 12/24/2019 for the symptoms described in the history of present illness. He was evaluated in the context of the global COVID-19 pandemic, which necessitated consideration that the patient might be at risk for infection with the SARS-CoV-2 virus that causes COVID-19. Institutional protocols and algorithms that pertain to the evaluation of patients at risk for COVID-19 are in a state of rapid change based on information released by regulatory bodies including the CDC and federal and state organizations. These policies and algorithms were followed during the patient's care in the ED.  Atypical chest pain inconsistent with ACS.  EKG without acute ischemic changes or evidence of pericarditis.  Initial troponin and delta troponin negative.  Low suspicion for pulmonary embolism.  Presentation not classic for aortic dissection or esophageal perforation.  Chest x-ray without evidence suggestive of pneumonia, pneumothorax, pneumomediastinum.  No abnormal contour of the mediastinum to suggest dissection. No evidence of acute injuries.  Likely secondary to second vaccine versus GERD from combining aspirin and Motrin doses      Final Clinical Impression(s) / ED Diagnoses Final diagnoses:  Precordial chest pain    The patient appears reasonably screened and/or stabilized for discharge and I doubt any other medical condition or other [facility] requiring further screening, evaluation, or treatment in the ED at this time prior to discharge. Safe for discharge with strict return precautions.  Disposition: Discharge  Condition: Good  I have discussed the results, Dx and Tx plan with the patient/family who expressed understanding and agree(s) with the plan. Discharge instructions discussed at length. The patient/family was given strict return  precautions who verbalized understanding of the instructions. No further questions at time of discharge.    ED Discharge Orders    None         Follow Up: Primary care provider  Schedule an appointment as soon as possible for a visit  If you do not have a primary care physician, contact [facility] at [phone] for referral     This chart was dictated using voice recognition software.  Despite best efforts to proofread,  errors can occur which can change the documentation meaning.   Nira Conn, MD 12/24/19 0003

## 2019-12-24 NOTE — H&P (Signed)
 [facility] Hospitalist Admission H&P  Assessment/Plan:   Principal Problem:   Chest pain on exertion Active Problems:   Tobacco abuse disorder Resolved Problems:   * No resolved hospital problems. *   Austin Green is a 44 y.o. male with PMHx as noted below who presented to [facility] with exertional chest pressure/discomfort.  CHEST PAIN: patient will undergo further evaluation and testing in the Observation Unit for complaints of exertional chest pressure/discomfort. Symptoms are consistent with non-cardiac chest pain. Patient has the following cardiac risk factors: Current or recent (<1 month) smoker. First troponin negative and initial ECG noted ST elevations ~39mm in inferior leads.   HEART SCORE: History Slightly Suspicious (0) EKG Non-specific Repolarization Disturbance (1) Age < 45 (0) Risk Factors 1 or 2 (1) Troponin <= normal limit (0) TOTAL: 2  0-3: 0.9-1.7% risk of major adverse cardiac event in 6 weeks (MACE: Major Adverse Cardiac Events defined as: all-cause mortality, myocardial infarction, or coronary revascularization)  Based upon patients HEART score, this patient is at Low Risk for a major adverse cardiac event within 6 weeks.   - Cardiology consultation will be needed in AM  - Monitor on telemetry - Providing Aspirin 325 mg x1 in ED, then 81 mg in AM - Will perform serial troponin and ECG at 3+ hours after symptom onset (and if at high risk, again at ~6 hours from symptom onset) - If chest pain returns, repeat Troponin I and ECG - If second or subsequent troponins are >0.10 or patient develops dynamic ECG changes, will start Heparin gtt - Will provide sublingual nitro as needed for chest pain - NPO after midnight if diagnostic studies considered (hold beta blocker morning dose) - Risk stratify further with HgbA1c and Lipids if not recently performed within 3 months - Smoking cessation counseling for all smokers. Provided >3 minutes of smoking cessation  counseling.   Chronic Medical Conditions: None  FEN/GI: NPO after midnight and Regular diet  DVT prophylaxis: SCDs and Ambulation  Code Status: No Order  DISPOSITION LIST: [ ]  Anticipated Discharge Location: Home [ ]  PT/OT/DME: No needs anticipated [ ]  CM/SW needs: None anticipated [ ]  Meds/Rx:  Not yet prescribed. No special med needs [ ]  Teaching: None anticipated [ ]  Follow up appt: Appt needed  Admitting Provider Signature: Lilia Mater, MD Division of [facility] Medicine December 24, 2019 7:45 PM  ______________________________________________________________________ Chief Complaint: exertional chest pressure/discomfort   History of Present Illness:   Austin Green is a 45 y.o. male with PMHx as noted below who presented to [facility] with exertional chest pressure/discomfort. The patient admits to chest discomfort that is intermittent, located in the left chest with no radiation, rated as a scale of 2/10 in intensity that is sharp, brief in nature. Onset of symptoms was abrupt starting 2 days ago, stable course since that time. Each episode lasts for 2 seconds. The episodes are brought on by exertion. Associated symptoms are: nothing. Aggravating factors are: exercise. Alleviating factors are: none. The patient denies fever, cough, dyspnea, abdominal pain, nausea, vomiting, leg swelling and diaphoresis.  He has a long history of physical activity and exertion for work and has never had any symptoms like this. Saturday was the first time. He motions to his upper left chest with an open hand. The episodes are brief and he does not have to stop what he is doing for them to resolve. He has taken Tylenol which seems to keep them from happening for several hours. He has had maybe 3 episodes each  day -- Saturday, went to [facility] ED and was discharged, rested Sunday, went to work today and had another maybe 3 episodes. He just wanted to be sure it wasn't his heart. He got the second Pfizer vaccine  last week and wonders if maybe it is related to that.  Patient was seen in the ED. Care prior to arrival consisted of ASA. Care in the ED consisted of Troponin I (negative) and ECG (STE in inferior leads with TWI in lead 1). Cardiology was consulted and recommended admission overnight for stress test in the AM.    Allergies:  Patient has no known allergies. to medications. He is allergic to seafood which causes throat itching.   Medications:   Tylenol as needed  Past Medical History:  Patient denies any medical history. No HTN, HL, DM, hyperglycemia, lung or heart problems previously. Past Medical History:  Diagnosis Date   Renal disorder    Substance abuse (CMS-HCC)     Past Surgical History:   No past surgical history on file.   Social History:  Works in aeronautical engineer and farm work, same job for many years. Lives with his 60yo mother, they take care of each other. Has 1 22yo daughter and 1 4yo daughter. Denies alcohol use. Smokes 1ppd since high school and is motivated to quit. Smokes occasional marijuana.  Social History   Socioeconomic History   Marital status: Single    Spouse name: Not on file   Number of children: Not on file   Years of education: Not on file   Highest education level: Not on file  Occupational History   Not on file  Tobacco Use   Smoking status: Heavy Tobacco Smoker   Smokeless tobacco: Never Used  Substance and Sexual Activity   Alcohol use: Never   Drug use: Never   Sexual activity: Not on file  Other Topics Concern   Not on file  Social History Narrative   Not on file   Social Determinants of Health   Financial Resource Strain:    Difficulty of Paying Living Expenses:   Food Insecurity:    Worried About Running Out of Food in the Last Year:    Barista in the Last Year:   Transportation Needs:    Freight Forwarder (Medical):    Lack of Transportation (Non-Medical):   Physical Activity:    Days of  Exercise per Week:    Minutes of Exercise per Session:   Stress:    Feeling of Stress :   Social Connections:    Frequency of Communication with Friends and Family:    Frequency of Social Gatherings with Friends and Family:    Attends Religious Services:    Active Member of Clubs or Organizations:    Attends Banker Meetings:    Marital Status:     Family History:  History reviewed. No pertinent family history.Mother alive and healthy. Sister alive and healthy. Father died of drug overdose when the patient was 44yo. No known cardiac disease, high blood pressure, or DM.   Review of Systems:  10 systems reviewed and are negative unless otherwise mentioned in HPI   Physical Exam:   Temp:  [36.2 C (97.2 F)] 36.2 C (97.2 F) Heart Rate:  [79-92] 79 SpO2 Pulse:  [79-83] 79 Resp:  [12-18] 12 BP: (110-125)/(70-84) 125/84 SpO2:  [93 %-100 %] 98 %,  Wt Readings from Last 3 Encounters:  12/05/19 79.4 kg (175 lb)  01/23/18 81.6 kg (180 lb)  Physical Exam Constitutional:      General: He is not in acute distress.    Appearance: Normal appearance. He is normal weight. He is not toxic-appearing or diaphoretic.  HENT:     Head: Normocephalic and atraumatic.     Nose: Nose normal.     Mouth/Throat:     Mouth: Mucous membranes are moist.  Eyes:     Extraocular Movements: Extraocular movements intact.     Conjunctiva/sclera: Conjunctivae normal.     Pupils: Pupils are equal, round, and reactive to light.  Cardiovascular:     Rate and Rhythm: Normal rate and regular rhythm.     Pulses: Normal pulses.     Heart sounds: Normal heart sounds. No murmur heard.  No friction rub. No gallop.   Pulmonary:     Effort: Pulmonary effort is normal. No respiratory distress.     Breath sounds: Normal breath sounds. No wheezing or rales.  Chest:     Chest wall: No tenderness.  Abdominal:     General: Abdomen is flat. Bowel sounds are normal.     Palpations: Abdomen is  soft.     Tenderness: There is no abdominal tenderness. There is no guarding.  Musculoskeletal:        General: No swelling. Normal range of motion.     Cervical back: Normal range of motion and neck supple.     Right lower leg: No edema.     Left lower leg: No edema.  Skin:    General: Skin is warm and dry.     Capillary Refill: Capillary refill takes less than 2 seconds.  Neurological:     General: No focal deficit present.     Mental Status: He is alert and oriented to person, place, and time.  Psychiatric:        Mood and Affect: Mood normal.        Behavior: Behavior normal.        Thought Content: Thought content normal.        Judgment: Judgment normal.      Labs/Studies/Diagnostics:  Labs and Studies from the last 24hrs per EMR and Reviewed

## 2019-12-24 NOTE — ED Notes (Signed)
Patient verbalizes understanding of discharge instructions. Opportunity for questioning and answers were provided. Armband removed by staff, pt discharged from ED. Pt. ambulatory and discharged home.  

## 2019-12-25 NOTE — Discharge Summary (Signed)
 " Physician Discharge Summary HBR 3 BT1 [facility] [address] [place] [address] Dept: [phone] Loc: [phone]   Identifying Information:  Austin Green [birth year] [id number]  Primary Care Physician: No PCP Per Patient   Code Status: Full Code  Admit Date: 12/24/2019  Discharge Date: 12/25/2019   Discharge To: Home  Discharge Service: HBR - HBC: Hospitalist Service #2   Discharge Attending Physician: Damien Shawnee Saa, MD  Discharge Diagnoses: Principal Problem:   Chest pain on exertion POA: Yes Active Problems:   Tobacco abuse disorder POA: Unknown Resolved Problems:   * No resolved hospital problems. *   Outpatient Provider Follow Up Issues:  [ ]  Outpatient treadmill stress test for evaluation of chest pain  Hospital Course:  Mr. Sisemore is a 44 year old male with history of tobacco use who presented to the ED for evaluation of atypical chest pain. He reported recurrent, self limited episodes of chest pain that occurred with exertion. Chest pain is atypical by history. He has no history of cardiovascular disease. EKG on admission showed nonspecific ST segment changes, so admission was requested for chest pain rule out. Troponin was negative x 3. Repeat EKG showed no concerning findings. No events noted on telemetry. The following morning, patient was feeling well and desired to complete a stress test as an outpatient. An order was placed for treadmill stress test.   Procedures: No admission procedures for hospital encounter. ______________________________________________________________________ Discharge Medications:   Your Medication List    CONTINUE taking these medications   ibuprofen  600 MG tablet Commonly known as: MOTRIN  Take 1 tablet (600 mg total) by mouth every six (6) hours as needed for pain.       Allergies: Patient has no known allergies. ______________________________________________________________________ Pending Test  Results (if blank, then none): Pending Labs    Order Current Status   Lipid panel Collected (12/25/19 0748)      Most Recent Labs: All lab results last 24 hours -  Recent Results (from the past 24 hour(s))  ECG 12 lead (Adult)   Collection Time: 12/24/19  3:57 PM  Result Value Ref Range   EKG Systolic BP  mmHg   EKG Diastolic BP  mmHg   EKG Ventricular Rate 82 BPM   EKG Atrial Rate 82 BPM   EKG P-R Interval 180 ms   EKG QRS Duration 88 ms   EKG Q-T Interval 382 ms   EKG QTC Calculation 446 ms   EKG Calculated P Axis 69 degrees   EKG Calculated R Axis 55 degrees   EKG Calculated T Axis 57 degrees   QTC Fredericia 424 ms  Comprehensive Metabolic Panel   Collection Time: 12/24/19  4:54 PM  Result Value Ref Range   Sodium 137 135 - 145 mmol/L   Potassium 4.0 3.5 - 5.1 mmol/L   Chloride 105 98 - 107 mmol/L   Anion Gap 3 3 - 11 mmol/L   CO2 28.8 20.0 - 31.0 mmol/L   BUN 12 9 - 23 mg/dL   Creatinine 9.03 9.39 - 1.10 mg/dL   BUN/Creatinine Ratio 13    EGFR CKD-EPI Non-African American, Male >90 mL/min/1.65m2   EGFR CKD-EPI African American, Male >90 mL/min/1.32m2   Glucose 93 70 - 179 mg/dL   Calcium 9.8 8.7 - 89.5 mg/dL   Albumin 4.0 3.4 - 5.0 g/dL   Total Protein 8.4 (H) 5.7 - 8.2 g/dL   Total Bilirubin 0.4 0.3 - 1.2 mg/dL   AST 25 <65 U/L   ALT 42 10 -  49 U/L   Alkaline Phosphatase 71 46 - 116 U/L  Troponin I   Collection Time: 12/24/19  4:54 PM  Result Value Ref Range   Troponin I <0.060 <0.060 ng/mL  PT-INR   Collection Time: 12/24/19  4:54 PM  Result Value Ref Range   PT 13.6 (H) 10.5 - 13.5 sec   INR 1.15   CBC w/ Differential   Collection Time: 12/24/19  4:54 PM  Result Value Ref Range   WBC 5.6 3.5 - 10.5 10*9/L   RBC 3.69 (L) 4.32 - 5.72 10*12/L   HGB 12.2 (L) 13.5 - 17.5 g/dL   HCT 63.6 (L) 61.9 - 49.9 %   MCV 98.4 (H) 81.0 - 95.0 fL   MCH 33.1 26.0 - 34.0 pg   MCHC 33.7 30.0 - 36.0 g/dL   RDW 87.2 87.9 - 84.9 %   MPV 7.6 7.0 - 10.0 fL   Platelet  392 150 - 450 10*9/L   nRBC 0 <=4 /100 WBCs   Neutrophils % 54.0 %   Lymphocytes % 30.2 %   Monocytes % 8.3 %   Eosinophils % 6.7 %   Basophils % 0.8 %   Absolute Neutrophils 3.0 1.7 - 7.7 10*9/L   Absolute Lymphocytes 1.7 0.7 - 4.0 10*9/L   Absolute Monocytes 0.5 0.1 - 1.0 10*9/L   Absolute Eosinophils 0.4 0.0 - 0.7 10*9/L   Absolute Basophils 0.0 0.0 - 0.1 10*9/L  Total CK (Creatine Kinase)   Collection Time: 12/24/19  4:54 PM  Result Value Ref Range   Creatine Kinase, Total 81.0 46.0 - 171.0 U/L  Hemoglobin A1c   Collection Time: 12/24/19  4:54 PM  Result Value Ref Range   Hemoglobin A1C 5.1 4.8 - 5.6 %   Estimated Average Glucose 100 mg/dL  RNCPI-80 PCR   Collection Time: 12/24/19  7:55 PM   Specimen: Nasopharyngeal Swab  Result Value Ref Range   SARS-CoV-2 PCR Negative Negative  Troponin I   Collection Time: 12/24/19  7:58 PM  Result Value Ref Range   Troponin I <0.060 <0.060 ng/mL  ECG 12 Lead   Collection Time: 12/24/19  9:44 PM  Result Value Ref Range   EKG Systolic BP  mmHg   EKG Diastolic BP  mmHg   EKG Ventricular Rate 76 BPM   EKG Atrial Rate 76 BPM   EKG P-R Interval 336 ms   EKG QRS Duration 86 ms   EKG Q-T Interval 394 ms   EKG QTC Calculation 443 ms   EKG Calculated P Axis 29 degrees   EKG Calculated R Axis 28 degrees   EKG Calculated T Axis 37 degrees   QTC Fredericia 426 ms  Troponin I   Collection Time: 12/24/19 10:16 PM  Result Value Ref Range   Troponin I <0.060 <0.060 ng/mL  Troponin I   Collection Time: 12/25/19  1:10 AM  Result Value Ref Range   Troponin I <0.060 <0.060 ng/mL    Relevant Studies/Radiology (if blank, then none): ECG 12 Lead  Result Date: 12/25/2019 SINUS RHYTHM WITH 1ST DEGREE AV BLOCK ST ELEVATION, CONSIDER EARLY REPOLARIZATION, PERICARDITIS, OR INJURY NONSPECIFIC ST AND T WAVE ABNORMALITY ABNORMAL ECG WHEN COMPARED WITH ECG OF 24-Dec-2019 15:57, PR INTERVAL HAS INCREASED T WAVE INVERSION NOW EVIDENT IN ANTERIOR  LEADS  ECG 12 lead (Adult)  Result Date: 12/24/2019 NORMAL SINUS RHYTHM ST ELEVATION, CONSIDER EARLY REPOLARIZATION, PERICARDITIS, OR INJURY ABNORMAL ECG NO PREVIOUS ECGS AVAILABLE  XR Chest 2 views  Result Date: 12/24/2019 EXAM:  XR CHEST 2 VIEWS DATE: 12/24/2019 5:11 PM ACCESSION: [id number] UN DICTATED: 12/24/2019 5:13 PM INTERPRETATION LOCATION: Main [facility] CLINICAL INDICATION: 44 years old Male with CHEST PAIN ; Chest Pain  COMPARISON: Chest radiograph 05/03/2008 TECHNIQUE: PA and Lateral Chest Radiographs. FINDINGS: Radiographically clear lungs. No pleural effusion or pneumothorax. Unremarkable cardiomediastinal silhouette.   No acute airspace disease.  ______________________________________________________________________ Discharge Instructions:  Activity Instructions    Activity as tolerated        Diet Instructions    Discharge diet (specify)     Discharge Nutrition Therapy: Regular      Other Instructions    Call MD for:  difficulty breathing, headache or visual disturbances     Call MD for:  persistent dizziness or light-headedness     Call MD for:  persistent nausea or vomiting     Call MD for:  severe uncontrolled pain     Call MD for:  temperature >38.5 Celsius     Discharge instructions     You were admitted to the hospital to monitor your heart and make sure your chest pain wasn't coming from your heart. All of your tests came back looking good. We do recommend that you get a stress test as an outpatient, and I've put in an appointment for this test. The clinic will contact you about a date and time for this test.       You were admitted to the hospital to monitor your heart and make sure your chest pain wasn't coming from your heart. All of your tests came back looking good. We do recommend that you get a stress test as an outpatient, and I've put in an appointment for this test. The clinic will contact you about a date and time for this test.      Follow Up  instructions and Outpatient Referrals    Call MD for:  difficulty breathing, headache or visual disturbances     Call MD for:  persistent dizziness or light-headedness     Call MD for:  persistent nausea or vomiting     Call MD for:  severe uncontrolled pain     Call MD for:  temperature >38.5 Celsius     Discharge instructions     You were admitted to the hospital to monitor your heart and make sure your chest pain wasn't coming from your heart. All of your tests came back looking good. We do recommend that you get a stress test as an outpatient, and I've put in an appointment for this test. The clinic will contact you about a date and time for this test.      Appointments which have been scheduled for you   Jan 16, 2020  3:00 PM (Arrive by 2:45 PM) NEW GENERAL with Alm Orvin Sprinkle, MD [facility] UROLOGY [facility]) [address] [place] [address] [phone]       ______________________________________________________________________ Discharge Day Services: BP 121/71   Pulse 69   Temp 36.1 C (97 F) (Temporal)   Resp 15   Ht 180.3 cm (5' 11)   Wt 77.1 kg (170 lb)   SpO2 100%   BMI 23.71 kg/m  Pt seen on the day of discharge and determined appropriate for discharge.  Condition at Discharge: good  Length of Discharge: I spent greater than 30 mins in the discharge of this patient.  "

## 2019-12-25 NOTE — Telephone Encounter (Signed)
 12/25/19  Travel Screening Questions Completed.  Travel Screening Questions/Answers: 1).   2).   3).   4).     Negative Travel Screen: Patient answered NO to questions 2, 3, and 4. Offer Telephone visit, Virtual Visit or In Person Visit according to the current scheduling protocol for your clinic.    The call was handled in the following manner: Scheduled for In Person Visit

## 2019-12-25 NOTE — Care Plan (Signed)
Problem: Adult Inpatient Plan of Care  Goal: Plan of Care Review  Outcome: Discharged to Home  Goal: Patient-Specific Goal (Individualization)  Outcome: Discharged to Home  Goal: Absence of Hospital-Acquired Illness or Injury  Outcome: Discharged to Home  Goal: Optimal Comfort and Wellbeing  Outcome: Discharged to Home  Goal: Readiness for Transition of Care  Outcome: Discharged to Home  Goal: Rounds/Family Conference  Outcome: Discharged to Home

## 2020-01-06 ENCOUNTER — Emergency Department (HOSPITAL_COMMUNITY)
Admission: EM | Admit: 2020-01-06 | Discharge: 2020-01-07 | Disposition: A | Discharge status: Home / Self Care | Payer: Self-pay | Attending: Emergency Medicine | Admitting: Emergency Medicine

## 2020-01-06 ENCOUNTER — Emergency Department (HOSPITAL_COMMUNITY): Payer: Self-pay

## 2020-01-06 ENCOUNTER — Encounter (HOSPITAL_COMMUNITY): Payer: Self-pay

## 2020-01-06 ENCOUNTER — Other Ambulatory Visit: Payer: Self-pay

## 2020-01-06 DIAGNOSIS — Z5321 Procedure and treatment not carried out due to patient leaving prior to being seen by health care provider: Secondary | ICD-10-CM | POA: Insufficient documentation

## 2020-01-06 DIAGNOSIS — R0789 Other chest pain: Secondary | ICD-10-CM | POA: Insufficient documentation

## 2020-01-06 LAB — BASIC METABOLIC PANEL
Anion gap: 6 (ref 5–15)
BUN: 14 mg/dL (ref 6–20)
CO2: 30 mmol/L (ref 22–32)
Calcium: 9.6 mg/dL (ref 8.9–10.3)
Chloride: 102 mmol/L (ref 98–111)
Creatinine, Ser: 1 mg/dL (ref 0.61–1.24)
GFR calc Af Amer: >60 mL/min (ref >60)
GFR calc non Af Amer: >60 mL/min (ref >60)
Glucose, Bld: 105 mg/dL — ABNORMAL HIGH (ref 70–99)
Potassium: 4.1 mmol/L (ref 3.5–5.1)
Sodium: 138 mmol/L (ref 135–145)

## 2020-01-06 LAB — CBC
HCT: 36.3 % — ABNORMAL LOW (ref 39.0–52.0)
Hemoglobin: 11.7 g/dL — ABNORMAL LOW (ref 13.0–17.0)
MCH: 32.6 pg (ref 26.0–34.0)
MCHC: 32.2 g/dL (ref 30.0–36.0)
MCV: 101.1 fL — ABNORMAL HIGH (ref 80.0–100.0)
Platelets: 431 10*3/uL — ABNORMAL HIGH (ref 150–400)
RBC: 3.59 MIL/uL — ABNORMAL LOW (ref 4.22–5.81)
RDW: 11.9 % (ref 11.5–15.5)
WBC: 6.7 10*3/uL (ref 4.0–10.5)
nRBC: 0 % (ref 0.0–0.2)

## 2020-01-06 LAB — TROPONIN I (HIGH SENSITIVITY): Troponin I (High Sensitivity): <2 ng/L (ref <18)

## 2020-01-06 NOTE — ED Triage Notes (Signed)
Pt arrives POV for eval of L sided CP x 1 week, worse w/ exertion. Pt reports that he had a stress test at [facility] this week which was unremarkable, but reports pain unchanged

## 2020-01-07 NOTE — ED Notes (Signed)
Pt no response x3. Left stickers in chair

## 2020-09-03 IMAGING — CR DG CHEST 2V
2 series · 2 of 2 positions shown · non-contrast
Comparison: None.

CLINICAL DATA: Chest pain

EXAM:
CHEST - 2 VIEW

[chest pa]
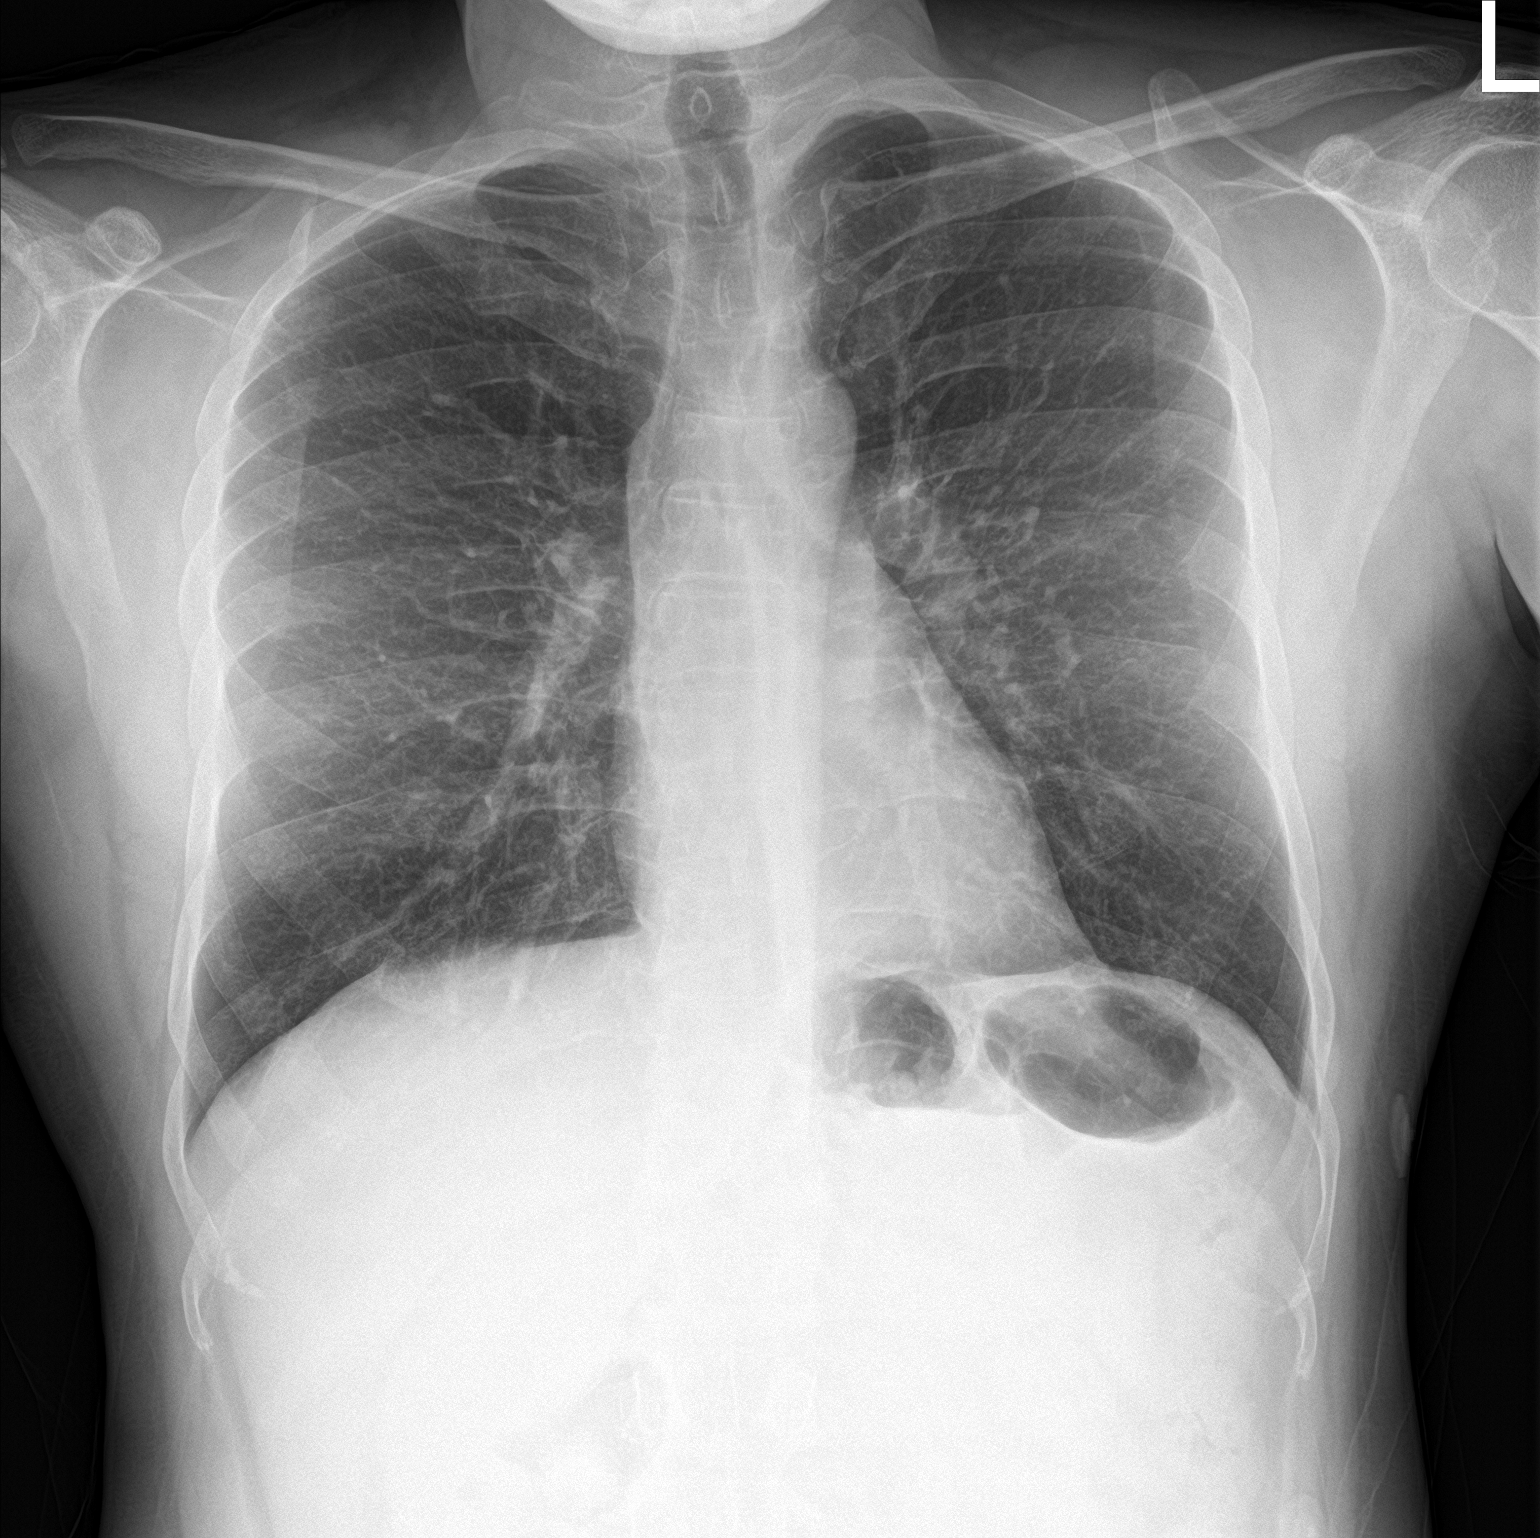

[chest lat]
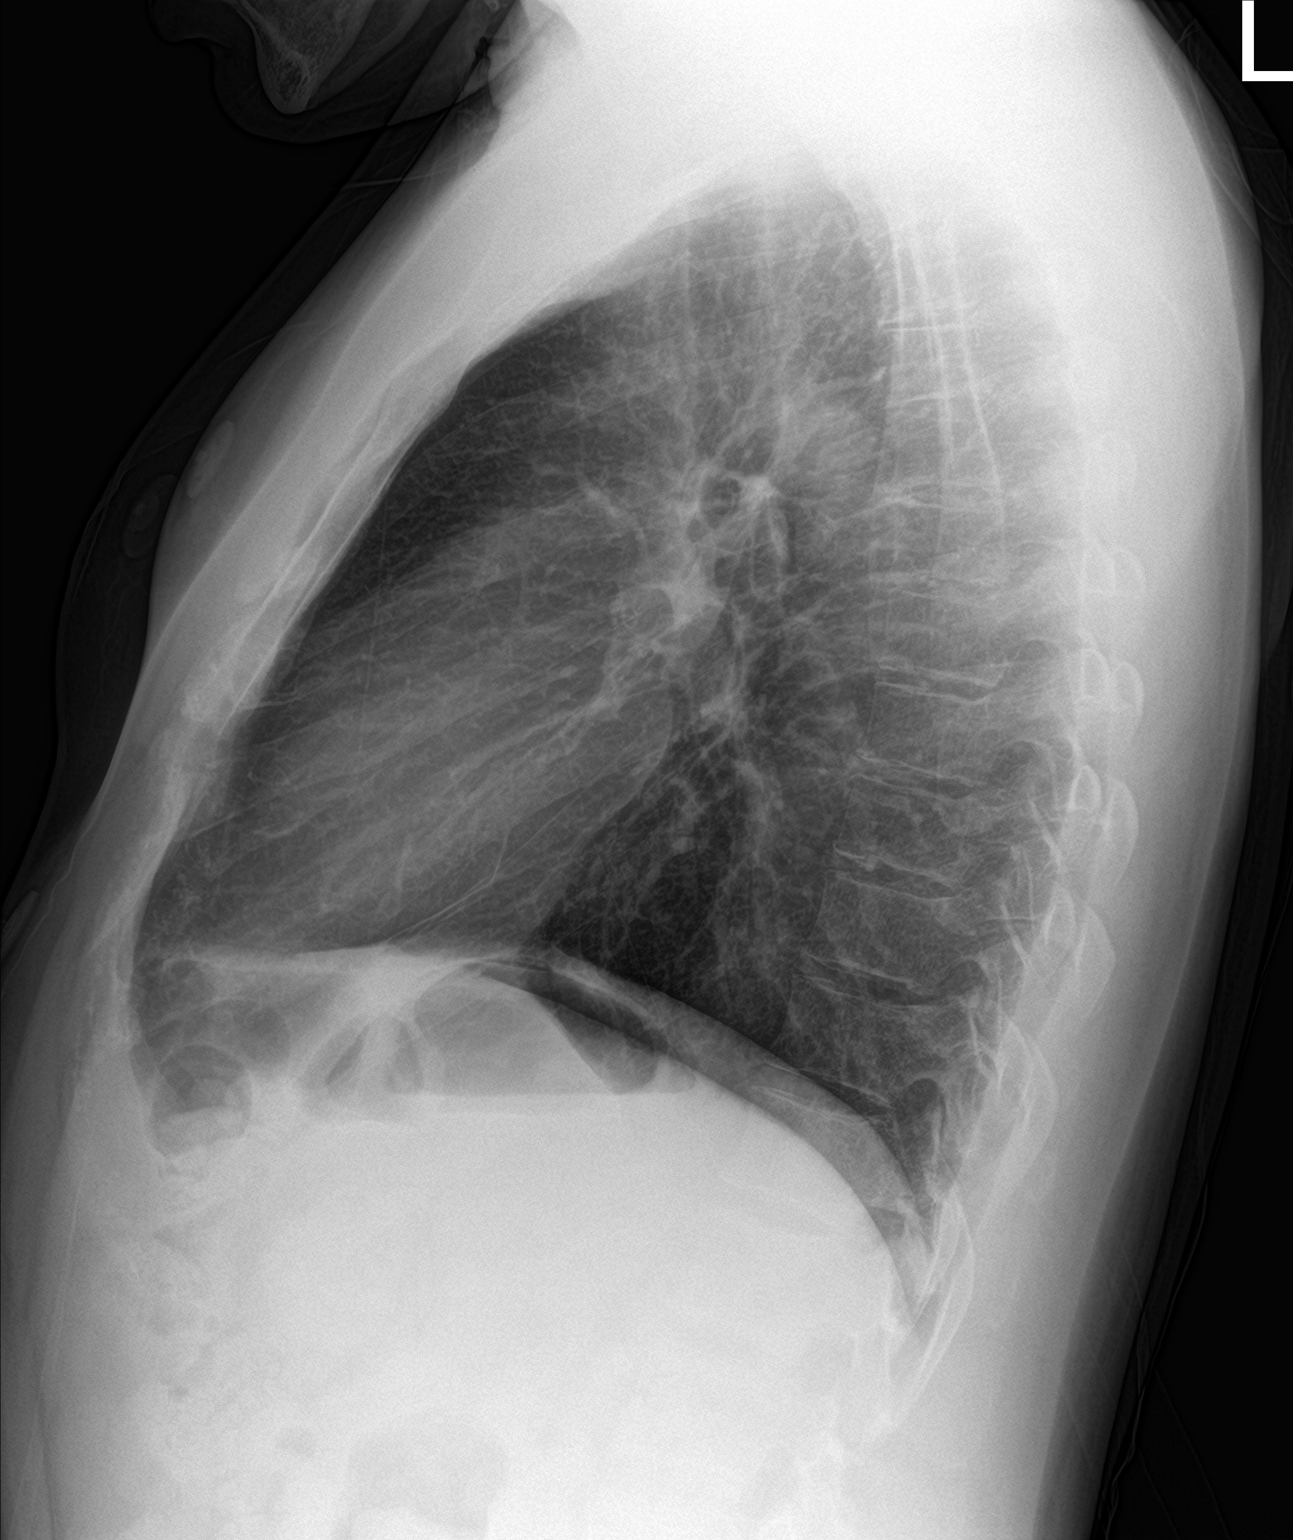

[2 of 2 positions shown; findings below may reference images not displayed]

FINDINGS: The heart size and mediastinal contours are within normal limits.
Both lungs are clear. The visualized skeletal structures are
unremarkable.
IMPRESSION: No active cardiopulmonary disease.

## 2024-06-24 ENCOUNTER — Emergency Department (HOSPITAL_COMMUNITY)
Admission: EM | Admit: 2024-06-24 | Discharge: 2024-06-24 | Disposition: A | Discharge status: Home / Self Care | Payer: Self-pay | Attending: Emergency Medicine | Admitting: Emergency Medicine

## 2024-06-24 ENCOUNTER — Encounter (HOSPITAL_COMMUNITY): Payer: Self-pay

## 2024-06-24 ENCOUNTER — Other Ambulatory Visit: Payer: Self-pay

## 2024-06-24 DIAGNOSIS — F1120 Opioid dependence, uncomplicated: Secondary | ICD-10-CM | POA: Insufficient documentation

## 2024-06-24 DIAGNOSIS — Z760 Encounter for issue of repeat prescription: Secondary | ICD-10-CM | POA: Insufficient documentation

## 2024-06-24 DIAGNOSIS — Z79899 Other long term (current) drug therapy: Secondary | ICD-10-CM

## 2024-06-24 MED ORDER — ONDANSETRON 4 MG PO TBDP
4.0000 mg | ORAL_TABLET | Freq: Once | ORAL | Status: AC
  Administered 2024-06-24: 4 mg via ORAL
  Filled 2024-06-24: qty 1

## 2024-06-24 MED ORDER — METHADONE HCL 10 MG PO TABS
70.0000 mg | ORAL_TABLET | Freq: Once | ORAL | Status: AC
  Administered 2024-06-24: 70 mg via ORAL
  Filled 2024-06-24: qty 7

## 2024-06-24 NOTE — Discharge Instructions (Signed)
 Follow-up in clinic Monday as scheduled. Return here for new concerns.

## 2024-06-24 NOTE — ED Triage Notes (Signed)
 Patient reports was here at the hospital with mother and has missed 2 doses of his methadone . He is from [place] and has an appt on monday

## 2024-06-24 NOTE — ED Provider Notes (Signed)
 " [facility] EMERGENCY DEPARTMENT AT  HOSPITAL Provider Note   CSN: [id number] Arrival date & time: 06/24/24  9740     Patient presents with: Medication Refill   Austin Green is a 49 y.o. male.   The history is provided by the patient and medical records.  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  He follows with a clinic in [facility] [place] .  He did have a take-home dose on Thursday, missed Friday and Saturday because he was here in [place] visiting his mother.  He is starting to feel very nauseated and sick on his stomach.  He has been on methadone  for the past 5 years.  He has been able to decrease his dosages (was on 85mg  but now on 70mg ) but has been unable to taper off of this completely.  He does have follow-up on Monday in clinic.  Prior to Admission medications  Medication Sig Start Date End Date Taking? Authorizing Provider  cyclobenzaprine  (FLEXERIL ) 10 MG tablet Take 1 tablet (10 mg total) by mouth at bedtime. 08/14/19   Odell Balls, PA-C  diazepam  (VALIUM ) 5 MG tablet Take 1 tablet (5 mg total) by mouth every 8 (eight) hours as needed for muscle spasms. 08/31/19   Trudy Dorn BRAVO, MD  Diclofenac  Sodium CR (VOLTAREN -XR) 100 MG 24 hr tablet Take 1 tablet (100 mg total) by mouth daily. 05/04/17   Palumbo, April, MD  ibuprofen  (ADVIL ) 600 MG tablet Take 1 tablet (600 mg total) by mouth 3 (three) times daily. 08/14/19   Odell Balls, PA-C  ibuprofen  (ADVIL ) 800 MG tablet Take 1 tablet (800 mg total) by mouth every 8 (eight) hours as needed. 08/31/19   Trudy Dorn BRAVO, MD  methadone  (DOLOPHINE ) 10 MG/ML solution Take 85 mg by mouth daily.    [provider]    Allergies: Shrimp [shellfish allergy]    Review of Systems  Constitutional:        Medication needs  All other systems reviewed and are negative.   Updated Vital  Signs BP 131/86 (BP Location: Left Arm)   Pulse 74   Temp 98.3 F (36.8 C)   Resp 18   Ht 5' 11 (1.803 m)   Wt 77.1 kg   SpO2 97%   BMI 23.71 kg/m   Physical Exam Vitals and nursing note reviewed.  Constitutional:      Appearance: He is well-developed.  HENT:     Head: Normocephalic and atraumatic.  Eyes:     Conjunctiva/sclera: Conjunctivae normal.     Pupils: Pupils are equal, round, and reactive to light.  Cardiovascular:     Rate and Rhythm: Normal rate and regular rhythm.     Heart sounds: Normal heart sounds.  Pulmonary:     Effort: Pulmonary effort is normal.     Breath sounds: Normal breath sounds.  Abdominal:     General: Bowel sounds are normal.     Palpations: Abdomen is soft.  Musculoskeletal:        General: Normal range of motion.     Cervical back: Normal range of motion.  Skin:    General: Skin is warm and dry.  Neurological:     Mental Status: He is alert and oriented to person, place, and time.     (all labs ordered are listed, but only abnormal results are displayed) Labs Reviewed - No data to display  EKG: None  Radiology: No results found.   Procedures   Medications Ordered in the ED  ondansetron  (ZOFRAN -ODT) disintegrating tablet 4 mg (4 mg Oral Given 06/24/24 0417)  methadone  (DOLOPHINE ) tablet 70 mg (70 mg Oral Given 06/24/24 0415)                                    Medical Decision Making Risk Prescription drug management.   49 year old male here requesting dose of his methadone .  Has not had this in the past 48 hours as he was out of town visiting his mother.  Usually follows with clinic in [place].  Takes 70 mg daily.  He is hemodynamically stable here.  He was given one-time dose here in the ER.  He has follow-up in the clinic on Monday.  Can return here for new concerns.  Final diagnoses:  Medication management    ED Discharge Orders     None          [name] 06/24/24 9547    Lorette Mayo,  MD 06/24/24 [phone]  "
